# Patient Record
Sex: Male | Born: 1945 | Race: White | Hispanic: No
Health system: Southern US, Community
[De-identification: ages and names within clinical notes are randomized; demographics above are authoritative.]

## PROBLEM LIST (undated history)

## (undated) DIAGNOSIS — C169 Malignant neoplasm of stomach, unspecified: Secondary | ICD-10-CM

## (undated) DIAGNOSIS — K219 Gastro-esophageal reflux disease without esophagitis: Secondary | ICD-10-CM

## (undated) DIAGNOSIS — E119 Type 2 diabetes mellitus without complications: Secondary | ICD-10-CM

---

## 2019-11-23 ENCOUNTER — Emergency Department (HOSPITAL_COMMUNITY): Payer: Medicare Other

## 2019-11-23 ENCOUNTER — Other Ambulatory Visit: Payer: Self-pay

## 2019-11-23 ENCOUNTER — Encounter (HOSPITAL_COMMUNITY): Admission: EM | Disposition: E | Payer: Self-pay | Source: Home / Self Care | Attending: Pulmonary Disease

## 2019-11-23 ENCOUNTER — Encounter (HOSPITAL_COMMUNITY): Payer: Self-pay | Admitting: *Deleted

## 2019-11-23 DIAGNOSIS — D72829 Elevated white blood cell count, unspecified: Secondary | ICD-10-CM

## 2019-11-23 DIAGNOSIS — Z515 Encounter for palliative care: Secondary | ICD-10-CM | POA: Diagnosis present

## 2019-11-23 DIAGNOSIS — J9601 Acute respiratory failure with hypoxia: Secondary | ICD-10-CM | POA: Diagnosis not present

## 2019-11-23 DIAGNOSIS — Z85028 Personal history of other malignant neoplasm of stomach: Secondary | ICD-10-CM

## 2019-11-23 DIAGNOSIS — N179 Acute kidney failure, unspecified: Secondary | ICD-10-CM | POA: Diagnosis present

## 2019-11-23 DIAGNOSIS — R001 Bradycardia, unspecified: Secondary | ICD-10-CM | POA: Diagnosis not present

## 2019-11-23 DIAGNOSIS — I501 Left ventricular failure: Secondary | ICD-10-CM | POA: Diagnosis present

## 2019-11-23 DIAGNOSIS — I442 Atrioventricular block, complete: Secondary | ICD-10-CM | POA: Diagnosis not present

## 2019-11-23 DIAGNOSIS — J9690 Respiratory failure, unspecified, unspecified whether with hypoxia or hypercapnia: Secondary | ICD-10-CM

## 2019-11-23 DIAGNOSIS — I2119 ST elevation (STEMI) myocardial infarction involving other coronary artery of inferior wall: Secondary | ICD-10-CM | POA: Diagnosis not present

## 2019-11-23 DIAGNOSIS — R570 Cardiogenic shock: Secondary | ICD-10-CM | POA: Diagnosis present

## 2019-11-23 DIAGNOSIS — K219 Gastro-esophageal reflux disease without esophagitis: Secondary | ICD-10-CM | POA: Diagnosis present

## 2019-11-23 DIAGNOSIS — Z452 Encounter for adjustment and management of vascular access device: Secondary | ICD-10-CM

## 2019-11-23 DIAGNOSIS — I2101 ST elevation (STEMI) myocardial infarction involving left main coronary artery: Secondary | ICD-10-CM

## 2019-11-23 DIAGNOSIS — Z20828 Contact with and (suspected) exposure to other viral communicable diseases: Secondary | ICD-10-CM | POA: Diagnosis present

## 2019-11-23 DIAGNOSIS — E119 Type 2 diabetes mellitus without complications: Secondary | ICD-10-CM | POA: Diagnosis present

## 2019-11-23 DIAGNOSIS — E872 Acidosis: Secondary | ICD-10-CM | POA: Diagnosis present

## 2019-11-23 DIAGNOSIS — Z9221 Personal history of antineoplastic chemotherapy: Secondary | ICD-10-CM

## 2019-11-23 DIAGNOSIS — R451 Restlessness and agitation: Secondary | ICD-10-CM | POA: Diagnosis present

## 2019-11-23 DIAGNOSIS — Z978 Presence of other specified devices: Secondary | ICD-10-CM

## 2019-11-23 HISTORY — DX: Malignant neoplasm of stomach, unspecified: C16.9

## 2019-11-23 HISTORY — DX: Type 2 diabetes mellitus without complications: E11.9

## 2019-11-23 HISTORY — DX: Gastro-esophageal reflux disease without esophagitis: K21.9

## 2019-11-23 LAB — CBG MONITORING, ED: Glucose-Capillary: 250 mg/dL — ABNORMAL HIGH (ref 70–99)

## 2019-11-23 LAB — CBC WITH DIFFERENTIAL/PLATELET
Abs Immature Granulocytes: 0.18 10*3/uL — ABNORMAL HIGH (ref 0.00–0.07)
Basophils Absolute: 0.1 10*3/uL (ref 0.0–0.1)
Basophils Relative: 0 %
Eosinophils Absolute: 0 10*3/uL (ref 0.0–0.5)
Eosinophils Relative: 0 %
HCT: 39.4 % (ref 39.0–52.0)
Hemoglobin: 12.1 g/dL — ABNORMAL LOW (ref 13.0–17.0)
Immature Granulocytes: 1 %
Lymphocytes Relative: 17 %
Lymphs Abs: 2.9 10*3/uL (ref 0.7–4.0)
MCH: 30 pg (ref 26.0–34.0)
MCHC: 30.7 g/dL (ref 30.0–36.0)
MCV: 97.5 fL (ref 80.0–100.0)
Monocytes Absolute: 1.6 10*3/uL — ABNORMAL HIGH (ref 0.1–1.0)
Monocytes Relative: 9 %
Neutro Abs: 12.2 10*3/uL — ABNORMAL HIGH (ref 1.7–7.7)
Neutrophils Relative %: 73 %
Platelets: 306 10*3/uL (ref 150–400)
RBC: 4.04 MIL/uL — ABNORMAL LOW (ref 4.22–5.81)
RDW: 12.8 % (ref 11.5–15.5)
WBC: 17 10*3/uL — ABNORMAL HIGH (ref 4.0–10.5)
nRBC: 0.1 % (ref 0.0–0.2)

## 2019-11-23 LAB — COMPREHENSIVE METABOLIC PANEL
ALT: 591 U/L — ABNORMAL HIGH (ref 0–44)
AST: 738 U/L — ABNORMAL HIGH (ref 15–41)
Albumin: 3.6 g/dL (ref 3.5–5.0)
Alkaline Phosphatase: 121 U/L (ref 38–126)
Anion gap: >20 — ABNORMAL HIGH (ref 5–15)
BUN: 51 mg/dL — ABNORMAL HIGH (ref 8–23)
CO2: 16 mmol/L — ABNORMAL LOW (ref 22–32)
Calcium: 8.4 mg/dL — ABNORMAL LOW (ref 8.9–10.3)
Chloride: 97 mmol/L — ABNORMAL LOW (ref 98–111)
Creatinine, Ser: 6.58 mg/dL — ABNORMAL HIGH (ref 0.61–1.24)
GFR calc Af Amer: 9 mL/min — ABNORMAL LOW (ref >60)
GFR calc non Af Amer: 8 mL/min — ABNORMAL LOW (ref >60)
Glucose, Bld: 305 mg/dL — ABNORMAL HIGH (ref 70–99)
Potassium: 4.5 mmol/L (ref 3.5–5.1)
Sodium: 134 mmol/L — ABNORMAL LOW (ref 135–145)
Total Bilirubin: 1 mg/dL (ref 0.3–1.2)
Total Protein: 7.4 g/dL (ref 6.5–8.1)

## 2019-11-23 LAB — POCT I-STAT 7, (LYTES, BLD GAS, ICA,H+H)
Acid-base deficit: 13 mmol/L — ABNORMAL HIGH (ref 0.0–2.0)
Bicarbonate: 14.8 mmol/L — ABNORMAL LOW (ref 20.0–28.0)
Calcium, Ion: 1 mmol/L — ABNORMAL LOW (ref 1.15–1.40)
HCT: 33 % — ABNORMAL LOW (ref 39.0–52.0)
Hemoglobin: 11.2 g/dL — ABNORMAL LOW (ref 13.0–17.0)
O2 Saturation: 100 %
Patient temperature: 98.4
Potassium: 5 mmol/L (ref 3.5–5.1)
Sodium: 136 mmol/L (ref 135–145)
TCO2: 16 mmol/L — ABNORMAL LOW (ref 22–32)
pCO2 arterial: 39.1 mmHg (ref 32.0–48.0)
pH, Arterial: 7.184 — CL (ref 7.350–7.450)
pO2, Arterial: 236 mmHg — ABNORMAL HIGH (ref 83.0–108.0)

## 2019-11-23 LAB — I-STAT CHEM 8, ED
BUN: 47 mg/dL — ABNORMAL HIGH (ref 8–23)
Calcium, Ion: 1.07 mmol/L — ABNORMAL LOW (ref 1.15–1.40)
Chloride: 104 mmol/L (ref 98–111)
Creatinine, Ser: 6.4 mg/dL — ABNORMAL HIGH (ref 0.61–1.24)
Glucose, Bld: 293 mg/dL — ABNORMAL HIGH (ref 70–99)
HCT: 40 % (ref 39.0–52.0)
Hemoglobin: 13.6 g/dL (ref 13.0–17.0)
Potassium: 4.6 mmol/L (ref 3.5–5.1)
Sodium: 135 mmol/L (ref 135–145)
TCO2: 18 mmol/L — ABNORMAL LOW (ref 22–32)

## 2019-11-23 LAB — POC SARS CORONAVIRUS 2 AG -  ED: SARS Coronavirus 2 Ag: NEGATIVE

## 2019-11-23 LAB — TROPONIN I (HIGH SENSITIVITY): Troponin I (High Sensitivity): 24310 ng/L (ref <18)

## 2019-11-23 LAB — RESPIRATORY PANEL BY RT PCR (FLU A&B, COVID)
Influenza A by PCR: NEGATIVE
Influenza B by PCR: NEGATIVE
SARS Coronavirus 2 by RT PCR: NEGATIVE

## 2019-11-23 SURGERY — CORONARY/GRAFT ACUTE MI REVASCULARIZATION
Anesthesia: LOCAL

## 2019-11-23 MED ORDER — HEPARIN (PORCINE) IN NACL 1000-0.9 UT/500ML-% IV SOLN
INTRAVENOUS | Status: AC
  Filled 2019-11-23: qty 1000

## 2019-11-23 MED ORDER — HEPARIN BOLUS VIA INFUSION
4000.0000 [IU] | Freq: Once | INTRAVENOUS | Status: AC
  Administered 2019-11-23: 4000 [IU] via INTRAVENOUS
  Filled 2019-11-23: qty 4000

## 2019-11-23 MED ORDER — LIDOCAINE HCL (PF) 1 % IJ SOLN
INTRAMUSCULAR | Status: AC
  Filled 2019-11-23: qty 30

## 2019-11-23 MED ORDER — DOPAMINE-DEXTROSE 3.2-5 MG/ML-% IV SOLN
0.0000 ug/kg/min | INTRAVENOUS | Status: DC
  Administered 2019-11-23: 6.717 ug/kg/min via INTRAVENOUS

## 2019-11-23 MED ORDER — FENTANYL CITRATE (PF) 100 MCG/2ML IJ SOLN
100.0000 ug | Freq: Once | INTRAMUSCULAR | Status: AC
  Administered 2019-11-24: 100 ug via INTRAVENOUS
  Filled 2019-11-23: qty 2

## 2019-11-23 MED ORDER — MIDAZOLAM HCL 2 MG/2ML IJ SOLN
2.0000 mg | Freq: Once | INTRAMUSCULAR | Status: AC
  Administered 2019-11-23: 2 mg via INTRAVENOUS

## 2019-11-23 MED ORDER — ETOMIDATE 2 MG/ML IV SOLN
INTRAVENOUS | Status: AC | PRN
  Administered 2019-11-23: 20 mg via INTRAVENOUS

## 2019-11-23 MED ORDER — ROCURONIUM BROMIDE 50 MG/5ML IV SOLN
INTRAVENOUS | Status: AC | PRN
  Administered 2019-11-23: 100 mg via INTRAVENOUS

## 2019-11-23 MED ORDER — ATROPINE SULFATE 1 MG/ML IJ SOLN
1.0000 mg | Freq: Once | INTRAMUSCULAR | Status: AC
  Administered 2019-11-23: 1 mg via INTRAVENOUS

## 2019-11-23 MED ORDER — SODIUM CHLORIDE 0.9 % IV SOLN: INTRAVENOUS | Status: DC

## 2019-11-23 MED ORDER — DOBUTAMINE IN D5W 4-5 MG/ML-% IV SOLN
2.5000 ug/kg/min | INTRAVENOUS | Status: DC
  Administered 2019-11-23: 2.5 ug/kg/min via INTRAVENOUS
  Filled 2019-11-23: qty 250

## 2019-11-23 MED ORDER — HEPARIN (PORCINE) 25000 UT/250ML-% IV SOLN
1150.0000 [IU]/h | INTRAVENOUS | Status: DC
  Administered 2019-11-23: 950 [IU]/h via INTRAVENOUS
  Administered 2019-11-24: 1150 [IU]/h via INTRAVENOUS

## 2019-11-23 MED ORDER — INSULIN ASPART 100 UNIT/ML ~~LOC~~ SOLN: 2.0000 [IU] | SUBCUTANEOUS | Status: DC

## 2019-11-23 MED ORDER — STERILE WATER FOR INJECTION IV SOLN
INTRAVENOUS | Status: DC
  Filled 2019-11-23: qty 850

## 2019-11-23 MED ORDER — MIDAZOLAM HCL 2 MG/2ML IJ SOLN
INTRAMUSCULAR | Status: AC
  Filled 2019-11-23: qty 2

## 2019-11-23 MED ORDER — FENTANYL CITRATE (PF) 100 MCG/2ML IJ SOLN
INTRAMUSCULAR | Status: AC
  Filled 2019-11-23: qty 2

## 2019-11-23 MED ORDER — VERAPAMIL HCL 2.5 MG/ML IV SOLN
INTRAVENOUS | Status: AC
  Filled 2019-11-23: qty 2

## 2019-11-23 MED ORDER — FENTANYL CITRATE (PF) 100 MCG/2ML IJ SOLN
INTRAMUSCULAR | Status: AC | PRN
  Administered 2019-11-23: 100 ug via INTRAVENOUS

## 2019-11-23 MED ORDER — SODIUM BICARBONATE 8.4 % IV SOLN
100.0000 meq | Freq: Once | INTRAVENOUS | Status: AC
  Administered 2019-11-23: 100 meq via INTRAVENOUS
  Filled 2019-11-23: qty 50

## 2019-11-23 MED ORDER — LORAZEPAM 2 MG/ML IJ SOLN
0.5000 mg | Freq: Once | INTRAMUSCULAR | Status: AC
  Administered 2019-11-23: 0.5 mg via INTRAVENOUS
  Filled 2019-11-23: qty 1

## 2019-11-23 NOTE — ED Provider Notes (Addendum)
[facility] EMERGENCY DEPARTMENT Provider Note   CSN: [id number] Arrival date & time: 12/14/2019  1800     History   Chief Complaint Chief Complaint  Patient presents with  . Altered Mental Status    HPI Jose Collins is a 73 y.o. male.     Patient presents with altered mental status.  While in the emergency department he started having agonal breaths.  The history is provided by medical records and a relative.  Altered Mental Status Presenting symptoms: behavior changes   Severity:  Severe Most recent episode:  Today Episode history:  Continuous Timing:  Constant Progression:  Worsening Chronicity:  New Context: not alcohol use   Associated symptoms: no abdominal pain     Past Medical History:  Diagnosis Date  . Diabetes mellitus without complication ([place])   . GERD (gastroesophageal reflux disease)   . Stomach cancer ([place])     There are no active problems to display for this patient.       Home Medications    Prior to Admission medications   Not on File    Family History No family history on file.  Social History Social History   Tobacco Use  . Smoking status: Not on file  Substance Use Topics  . Alcohol use: Not on file  . Drug use: Not on file     Allergies   Patient has no allergy information on record.   Review of Systems Review of Systems  Unable to perform ROS: Mental status change  Gastrointestinal: Negative for abdominal pain.     Physical Exam Updated Vital Signs BP (!) 64/50   Pulse (!) 45   Ht 5\' 7"  (1.702 m)   Wt 79.4 kg   SpO2 93%   BMI 27.41 kg/m   Physical Exam Vitals signs reviewed.  Constitutional:      Appearance: He is well-developed.     Comments: Patient lethargic  HENT:     Head: Normocephalic.     Nose: Nose normal.  Eyes:     General: No scleral icterus.    Conjunctiva/sclera: Conjunctivae normal.  Neck:     Musculoskeletal: Neck supple.     Thyroid: No thyromegaly.  Cardiovascular:   Heart sounds: No murmur. No friction rub. No gallop.      Comments: Patient has a rate of 40 and is in third-degree heart block Pulmonary:     Breath sounds: No stridor. No wheezing or rales.  Chest:     Chest wall: No tenderness.  Abdominal:     General: There is no distension.     Tenderness: There is no abdominal tenderness. There is no rebound.  Musculoskeletal: Normal range of motion.  Lymphadenopathy:     Cervical: No cervical adenopathy.  Skin:    Findings: No erythema or rash.  Neurological:     Motor: No abnormal muscle tone.     Coordination: Coordination normal.     Comments: Responding to painful stimuli only      ED Treatments / Results  Labs (all labs ordered are listed, but only abnormal results are displayed) Labs Reviewed  CBC WITH DIFFERENTIAL/PLATELET - Abnormal; Notable for the following components:      Result Value   WBC 17.0 (*)    RBC 4.04 (*)    Hemoglobin 12.1 (*)    Neutro Abs 12.2 (*)    Monocytes Absolute 1.6 (*)    Abs Immature Granulocytes 0.18 (*)    All other components within normal limits  CBG MONITORING, ED - Abnormal; Notable for the following components:   Glucose-Capillary 250 (*)    All other components within normal limits  I-STAT CHEM 8, ED - Abnormal; Notable for the following components:   BUN 47 (*)    Creatinine, Ser 6.40 (*)    Glucose, Bld 293 (*)    Calcium, Ion 1.07 (*)    TCO2 18 (*)    All other components within normal limits  SARS CORONAVIRUS 2 (TAT 6-24 HRS)  RESPIRATORY PANEL BY RT PCR (FLU A&B, COVID)  COMPREHENSIVE METABOLIC PANEL  TROPONIN I (HIGH SENSITIVITY)    EKG None  Radiology No results found.  Procedures Procedures (including critical care time)  Medications Ordered in ED Medications  atropine injection 1 mg (1 mg Intravenous Given 12/15/2019 1838)  atropine injection 1 mg (1 mg Intravenous Given 12/10/2019 1841)  atropine injection 1 mg (1 mg Intravenous Given 11/28/2019 1851)     Initial  Impression / Assessment and Plan / ED Course  I have reviewed the triage vital signs and the nursing notes.  Pertinent labs & imaging results that were available during my care of the patient were reviewed by me and considered in my medical decision making (see chart for details).        CRITICAL CARE Performed by: Milton Ferguson Total critical care time:40 minutes Critical care time was exclusive of separately billable procedures and treating other patients. Critical care was necessary to treat or prevent imminent or life-threatening deterioration. Critical care was time spent personally by me on the following activities: development of treatment plan with patient and/or surrogate as well as nursing, discussions with consultants, evaluation of patient's response to treatment, examination of patient, obtaining history from patient or surrogate, ordering and performing treatments and interventions, ordering and review of laboratory studies, ordering and review of radiographic studies, pulse oximetry and re-evaluation of patient's condition. Patient with agonal breaths.  He responded some to Narcan   patient was in third-degree heart block and hypotensive.  He was given atropine twice and responded minimally.  Then was started on a pacer.  His EKG suggest a STEMI.  We contacted Dr. Lorra Hals seeing cardiology and he accepted the patient to go to the Cath Lab now. Dr.Varonosi called back after the patient left the ED to go to the Cath Lab.  He decided he wanted the patient to stop in the ED and be evaluated again because he may need intubation before the procedure is done.  He thinks the patient may be too combative to get the procedure without intubation.  I called Dr.Steinl and notified him of the patient Final Clinical Impressions(s) / ED Diagnoses   Final diagnoses:  ST elevation myocardial infarction involving left main coronary artery [facility])  Third degree heart block [facility])    ED Discharge  Orders    None       Milton Ferguson, MD 11/23/19 1919    Milton Ferguson, MD 11/23/19 2018

## 2019-11-23 NOTE — ED Notes (Signed)
Pt still unresponsive, breathing and pulse present.

## 2019-11-23 NOTE — Procedures (Signed)
Endotracheal Intubation Procedure Note  Indication for endotracheal intubation: airway compromise and respiratory failure. Intubated for airway protection, AMS, increased work of breathing Airway Assessment: Mallampati Class: unable to assess, patient obtunded. Sedation: etomidate and fentanyl. Paralytic: rocuronium. Lidocaine: no. Atropine: no. Equipment: Macintosh 3 laryngoscope blade. Cricoid Pressure: yes. Number of attempts: 1. ETT location confirmed by by auscultation and colorimetry, CXR pending.  Collier Bullock 12/03/2019

## 2019-11-23 NOTE — Consult Note (Signed)
Cardiology Consultation:   Patient ID: Jose Collins MRN: [id number]; DOB: [birth year]  Admit date: 12/04/2019 Date of Consult: 12/13/2019  Primary Care Provider: [facility] Primary Cardiologist: No primary care provider on file. new (seen at [facility]) Primary Electrophysiologist:  None    Patient Profile:   Jose Collins is a 73 y.o. male with a hx of gastric cancer who is being seen today for Jose evaluation of dizziness/  at Jose request of Dr. Dewayne Hatch and Dr. Vanita Panda.  History of Present Illness:   Jose Collins is unable to give history.  He is confused and does not answer questions.  He only request water.  History was obtained by Jose Collins who is at bedside.  I also was on speaker phone when Dr. Roderic Palau was speaking to Jose Collins at [facility]  Jose patient came in earlier today with confusion.  ECG showed inferior ST elevation and there was episodic complete heart block.  He was transcutaneously paced.  He was found to be in renal failure with a creatinine of 6.4.  No baseline creatinine is available.  Jose Collins was unaware of any renal insufficiency but did state that Jose father typically did not share Jose health problems with Jose children as did not burden them.  Jose Collins spoke to Jose dad earlier today.  Jose mental status at Jose time was more clear.  He was short of breath and coughing frequently.  Apparently he had a respiratory illness about a week ago.  At that time he and Jose Collins who lives with them were tested for Covid and they were both negative.  No fevers or chills were reported.  Just shortness of breath and wheezing.  Per Jose Collins, Jose patient typically does take care of himself.  He [facility] most of Jose care at [facility].  Currently, Jose patient does not report chest pain.  He only reports that he wants water.  Transcutaneous pacer was turned down.  Jose patient did have rates anywhere from 68s through 66s.  By Jose time I left Jose room, he was  sinus bradycardia in Jose 60s.  Blood pressure was 96/62 when checked manually.  Jose automated cuff was not able to pick up blood pressure.  Heart Pathway Score:     Past Medical History:  Diagnosis Date  . Diabetes mellitus without complication ([place])   . GERD (gastroesophageal reflux disease)   . Stomach cancer [facility])          Inpatient Medications: Scheduled Meds:  Continuous Infusions:  PRN Meds:   Allergies:   No Known Allergies  Social History:  Lives with Jose Collins; was a [place] veteran per Jose Collins.  Unable to obtain history from Jose patient.  Social History   Socioeconomic History  . Marital status: Divorced    Spouse name: Not on file  . Number of children: Not on file  . Years of education: Not on file  . Highest education level: Not on file  Occupational History  . Not on file  Social Needs  . Financial resource strain: Not on file  . Food insecurity    Worry: Not on file    Inability: Not on file  . Transportation needs    Medical: Not on file    Non-medical: Not on file  Tobacco Use  . Smoking status: Not on file  Substance and Sexual Activity  . Alcohol use: Not on file  . Drug use: Not on file  .  Sexual activity: Not on file  Lifestyle  . Physical activity    Days per week: Not on file    Minutes per session: Not on file  . Stress: Not on file  Relationships  . Social Herbalist on phone: Not on file    Gets together: Not on file    Attends religious service: Not on file    Active member of club or organization: Not on file    Attends meetings of clubs or organizations: Not on file    Relationship status: Not on file  . Intimate partner violence    Fear of current or ex partner: Not on file    Emotionally abused: Not on file    Physically abused: Not on file    Forced sexual activity: Not on file  Other Topics Concern  . Not on file  Social History Narrative  . Not on file    Family History:   *No family history on  file.  Patient unable to give history.  ROS:  Please see Jose history of present illness.  Cough and wheezing per Jose Collins.  Patient unable to give history. All other ROS reviewed and negative.     Physical Exam/Data:   Vitals:   11/17/2019 1839 12/06/2019 1842 11/16/2019 1847 11/29/2019 1849  BP:   (!) 69/35 (!) 64/50  Pulse: (!) 51  (!) 45   SpO2:  96% 93%   Weight:      Height:       No intake or output data in Jose 24 hours ending 12/04/2019 2030 Last 3 Weights 12/07/2019  Weight (lbs) 175 lb  Weight (kg) 79.379 kg     Body mass index is 27.41 kg/m.  General:  Well nourished, well developed, agitated HEENT: normal Lymph: no adenopathy Neck: no JVD Endocrine:  No thryomegaly  Cardiac:  normal S1, S2; bradycardic; no audible murmurs Lungs: Mild wheezing bilaterally Abd: Obese Ext: no lower extremity edema Musculoskeletal:  No deformities, BUE and BLE strength normal and equal Skin: warm and dry  Neuro:  CNs 2-12 intact, no focal abnormalities noted Psych: Agitated  EKG:  Jose EKG was personally reviewed and demonstrates: Sinus rhythm with complete heart block and junctional escape rhythm, inferior Q waves with residual ST elevation Telemetry:  Telemetry was personally reviewed and demonstrates: At one point he was sinus bradycardia in Jose high 50s  Relevant CV Studies:   Laboratory Data:  High Sensitivity Troponin:   Recent Labs  Lab 11/25/2019 1832  TROPONINIHS 24,310*     Chemistry Recent Labs  Lab 11/28/2019 1832 12/03/2019 1836  NA 134* 135  K 4.5 4.6  CL 97* 104  CO2 16*  --   GLUCOSE 305* 293*  BUN 51* 47*  CREATININE 6.58* 6.40*  CALCIUM 8.4*  --   GFRNONAA 8*  --   GFRAA 9*  --   ANIONGAP >20*  --     Recent Labs  Lab 11/22/2019 1832  PROT 7.4  ALBUMIN 3.6  AST 738*  ALT 591*  ALKPHOS 121  BILITOT 1.0   Hematology Recent Labs  Lab 11/18/2019 1832 12/11/2019 1836  WBC 17.0*  --   RBC 4.04*  --   HGB 12.1* 13.6  HCT 39.4 40.0  MCV 97.5  --   MCH  30.0  --   MCHC 30.7  --   RDW 12.8  --   PLT 306  --    BNPNo results for input(s): BNP, PROBNP in  Jose last 168 hours.  DDimer No results for input(s): DDIMER in Jose last 168 hours.   Radiology/Studies:  No results found.  Assessment and Plan:   1. Subacute inferior MI: Based on Jose troponin, this MI occurred likely within Jose last few days.  Jose rhythm disturbance is likely related to Jose MI.  I spoke at length with Jose Collins.  Given Jose acute renal failure and Jose likelihood that we would not salvage much myocardium, we decided not to pursue coronary angiography.  2.    Complete heart block: Transcutaneous pacing was effective when he was found to be hypotensive at [employer].  Jose only way Jose patient would tolerate a transvenous pacer would be if he was intubated and sedated.  He is not cooperative enough to lie still.  If Jose heart rate were to drop again, would consider IV dopamine which would help both with Jose pressure and Jose heart rate.    3.  Acute renal failure: No records to know what Jose baseline creatinine is.  Collins did not know of any renal problems but admitted that Jose father did not tell Jose family members what about Jose health issues.  Some of this may be reversible, but it is unclear.  4.   Elevated white blood cell count: There may be some infection present, contributing to Jose hypotension.  Continue supportive care.  We will follow along.  Would use IV heparin for 24 to 48 hours at least given Jose MI.  Check echocardiogram.  Findings d/w Dr. Vanita Panda.     Critical care time 60 minutes.     For questions or updates, please contact [place] Please consult [email or web address] for contact info under     Signed, Larae Grooms, MD  12/11/2019 8:30 PM

## 2019-11-23 NOTE — ED Notes (Signed)
Dr. Roderic Palau ordered to pace pt at rate of 60.

## 2019-11-23 NOTE — ED Notes (Signed)
EKG repeated

## 2019-11-23 NOTE — ED Notes (Signed)
Dr. Roderic Palau testing, gag reflex, pt has gag reflex.

## 2019-11-23 NOTE — ED Notes (Signed)
Capture at 74ma at rate of 60.

## 2019-11-23 NOTE — ED Notes (Signed)
Pt became unresponsive in hallway. Pt moved to a room. Pulse present, o2 sat in 60's.  Pt being bagged, o2 sat increasing,  81% at present.  EDP at bedside.  CBG 250.  Narcan 2mg  IV given.

## 2019-11-23 NOTE — ED Triage Notes (Signed)
Pt brought in by [facility] with c/o AMS that started approximately 1 hour PTA. Family on scene reported to EMS that this was very abnormal for pt. Pt is very confused at time of triage. EMS reports he knew who his family was but he was confused about everything else going on. EMS reports BP was low at 70/42 from IT trainer first responders. EMS unable to obtain BP due to pt's confusion with agitation.

## 2019-11-23 NOTE — ED Notes (Signed)
2 liters of ns infusing,  BP 67/53

## 2019-11-23 NOTE — ED Notes (Signed)
Date and time results received: 12/03/2019 19:20 (use smartphrase ".now" to insert current time)  Test: trop  Critical Value: 24310  Name of Provider Notified: Dr Roderic Palau  Orders Received? Or Actions Taken?: none

## 2019-11-23 NOTE — ED Provider Notes (Addendum)
Patient seen on arrival from any pending, and his case discussed with our cardiology colleagues, soon thereafter. Please see initial note for the patient's initial assessment. In essence the patient presented with altered mental status, was found to have complete heart block and evidence for coronary ischemia. Patient started on transcutaneous pacer, sent here for evaluation peer Here, the patient has been able to maintain native sinus rhythm, though with frequent bradycardia.  He remains hypotensive, and labs now returning are notable for acute kidney injury, with a creatinine greater than 6. Patient is here with his son who does provide some historical details, the patient himself does not provide any. With concern for hypotension, bradycardia, and new renal dysfunction, patient is not a great candidate for catheterization for pacer or cardiac cath, per cardiology. Patient will require admission for fluid resuscitation, blood pressure support, dobutamine started, fluids running. Case discussed with critical care for admission.  10:09 PM  With critical care at bedside, with a group discussion about the patient's presentation, critical illness Patient is now on dopamine, but to start epinephrine, again requiring transcutaneous pacing, as there is evidence for hypoperfusion, likely contributing to his presentation.   11:16 PM Critical care is managing the patient's care, but chart update reasonable, patient continues to worsen, requiring intubation. Goals of care discussed with critical care, the patient's family members, who note that the patient is reasonable, but prefer no CPR.  CRITICAL CARE Performed by: Carmin Muskrat Total critical care time: 35 minutes Critical care time was exclusive of separately billable procedures and treating other patients. Critical care was necessary to treat or prevent imminent or life-threatening deterioration. Critical care was time spent personally by me  on the following activities: development of treatment plan with patient and/or surrogate as well as nursing, discussions with consultants, evaluation of patient's response to treatment, examination of patient, obtaining history from patient or surrogate, ordering and performing treatments and interventions, ordering and review of laboratory studies, ordering and review of radiographic studies, pulse oximetry and re-evaluation of patient's condition.   EKG Interpretation  Date/Time:  Tuesday November 23 2019 20:10:53 EST Ventricular Rate:  39 PR Interval:    QRS Duration: 116 QT Interval:  514 QTC Calculation: 414 R Axis:   -57 Text Interpretation: Sinus bradycardia Atrial premature complex Prolonged PR interval Nonspecific IVCD with LAD Inferior infarct, recent Consider anterolateral infarct Abnormal ECG Confirmed by Carmin Muskrat [phone]) on 11/26/2019 9:07:00 PM          Carmin Muskrat, MD 11/21/2019 2107    Carmin Muskrat, MD 12/02/2019 2210    Carmin Muskrat, MD 12/08/2019 2316

## 2019-11-23 NOTE — ED Notes (Signed)
EMS at bedside

## 2019-11-23 NOTE — ED Notes (Signed)
Pt verbal at this time.  Dr. Roderic Palau back at bedside.

## 2019-11-23 NOTE — ED Notes (Signed)
Respiratory placed nasal trumpet in left nare. Pt responsive during procedure, tolerated well.   NRB placed over nasal trumpet.  Pt diaphoretic.

## 2019-11-23 NOTE — Progress Notes (Signed)
ANTICOAGULATION CONSULT NOTE - Initial Consult  Pharmacy Consult for Heparin  Indication: chest pain/ACS  No Known Allergies  Patient Measurements: Height: 5\' 7"  (170.2 cm) Weight: 175 lb (79.4 kg) IBW/kg (Calculated) : 66.1 Heparin Dosing Weight: 79.4 kg  Vital Signs: BP: 74/48 (12/08 2045) Pulse Rate: 43 (12/08 2045)  Labs: Recent Labs    11/18/2019 1832 12/03/2019 1836  HGB 12.1* 13.6  HCT 39.4 40.0  PLT 306  --   CREATININE 6.58* 6.40*  TROPONINIHS 24,310*  --     Estimated Creatinine Clearance: 10.5 mL/min (A) (by C-G formula based on SCr of 6.4 mg/dL (H)).   Medical History: Past Medical History:  Diagnosis Date  . Diabetes mellitus without complication ([place])   . GERD (gastroesophageal reflux disease)   . Stomach cancer ([facility])     Medications:  Infusions:  . DOBUTamine    . DOPamine    . heparin    .  sodium bicarbonate (isotonic) infusion in sterile water      Assessment: Patient is a 12 yom that present to the ED with confusion. When an EKG was obtained there was inferior ST elevation and episodic complete heart block. Patient was paced and transferred to [facility] Pharmacy has been consulted to dose heparin in this patient for their MI.   Goal of Therapy:  Heparin level 0.3-0.7 units/ml Monitor platelets by anticoagulation protocol: Yes   Plan:  - Heparin bolus of 4000 units IV x 1 dose  - Followed by Heparin drip @ 950 units/hr  - Obtain Heparin level in 8 hours (with am labs)  - Monitor patient for s/s of bleeding and CBC daily while on heparin.   Duanne Limerick PharmD. BCPS  11/23/2019,9:28 PM

## 2019-11-23 NOTE — ED Notes (Signed)
Pt attempting to get oob.  Redirected pt.

## 2019-11-24 ENCOUNTER — Inpatient Hospital Stay (HOSPITAL_COMMUNITY): Payer: Medicare Other

## 2019-11-24 DIAGNOSIS — E119 Type 2 diabetes mellitus without complications: Secondary | ICD-10-CM | POA: Diagnosis present

## 2019-11-24 DIAGNOSIS — J9690 Respiratory failure, unspecified, unspecified whether with hypoxia or hypercapnia: Secondary | ICD-10-CM | POA: Diagnosis present

## 2019-11-24 DIAGNOSIS — I361 Nonrheumatic tricuspid (valve) insufficiency: Secondary | ICD-10-CM | POA: Diagnosis not present

## 2019-11-24 DIAGNOSIS — Z9221 Personal history of antineoplastic chemotherapy: Secondary | ICD-10-CM | POA: Diagnosis not present

## 2019-11-24 DIAGNOSIS — R570 Cardiogenic shock: Secondary | ICD-10-CM | POA: Diagnosis present

## 2019-11-24 DIAGNOSIS — K219 Gastro-esophageal reflux disease without esophagitis: Secondary | ICD-10-CM | POA: Diagnosis present

## 2019-11-24 DIAGNOSIS — I340 Nonrheumatic mitral (valve) insufficiency: Secondary | ICD-10-CM | POA: Diagnosis not present

## 2019-11-24 DIAGNOSIS — J9601 Acute respiratory failure with hypoxia: Secondary | ICD-10-CM | POA: Diagnosis not present

## 2019-11-24 DIAGNOSIS — E872 Acidosis: Secondary | ICD-10-CM | POA: Diagnosis present

## 2019-11-24 DIAGNOSIS — I442 Atrioventricular block, complete: Secondary | ICD-10-CM | POA: Diagnosis present

## 2019-11-24 DIAGNOSIS — I501 Left ventricular failure: Secondary | ICD-10-CM | POA: Diagnosis present

## 2019-11-24 DIAGNOSIS — D72829 Elevated white blood cell count, unspecified: Secondary | ICD-10-CM | POA: Diagnosis present

## 2019-11-24 DIAGNOSIS — I2119 ST elevation (STEMI) myocardial infarction involving other coronary artery of inferior wall: Secondary | ICD-10-CM | POA: Diagnosis present

## 2019-11-24 DIAGNOSIS — Z85028 Personal history of other malignant neoplasm of stomach: Secondary | ICD-10-CM | POA: Diagnosis not present

## 2019-11-24 DIAGNOSIS — R001 Bradycardia, unspecified: Secondary | ICD-10-CM | POA: Diagnosis not present

## 2019-11-24 DIAGNOSIS — Z515 Encounter for palliative care: Secondary | ICD-10-CM | POA: Diagnosis present

## 2019-11-24 DIAGNOSIS — N179 Acute kidney failure, unspecified: Secondary | ICD-10-CM | POA: Diagnosis present

## 2019-11-24 DIAGNOSIS — Z20828 Contact with and (suspected) exposure to other viral communicable diseases: Secondary | ICD-10-CM | POA: Diagnosis present

## 2019-11-24 DIAGNOSIS — R451 Restlessness and agitation: Secondary | ICD-10-CM | POA: Diagnosis present

## 2019-11-24 LAB — POCT I-STAT 7, (LYTES, BLD GAS, ICA,H+H)
Acid-base deficit: 11 mmol/L — ABNORMAL HIGH (ref 0.0–2.0)
Bicarbonate: 16.8 mmol/L — ABNORMAL LOW (ref 20.0–28.0)
Calcium, Ion: 0.98 mmol/L — ABNORMAL LOW (ref 1.15–1.40)
HCT: 33 % — ABNORMAL LOW (ref 39.0–52.0)
Hemoglobin: 11.2 g/dL — ABNORMAL LOW (ref 13.0–17.0)
O2 Saturation: 95 %
Patient temperature: 98.4
Potassium: 5.1 mmol/L (ref 3.5–5.1)
Sodium: 136 mmol/L (ref 135–145)
TCO2: 18 mmol/L — ABNORMAL LOW (ref 22–32)
pCO2 arterial: 43.3 mmHg (ref 32.0–48.0)
pH, Arterial: 7.195 — CL (ref 7.350–7.450)
pO2, Arterial: 90 mmHg (ref 83.0–108.0)

## 2019-11-24 LAB — COMPREHENSIVE METABOLIC PANEL
ALT: 794 U/L — ABNORMAL HIGH (ref 0–44)
AST: 893 U/L — ABNORMAL HIGH (ref 15–41)
Albumin: 2.9 g/dL — ABNORMAL LOW (ref 3.5–5.0)
Alkaline Phosphatase: 134 U/L — ABNORMAL HIGH (ref 38–126)
Anion gap: 24 — ABNORMAL HIGH (ref 5–15)
BUN: 64 mg/dL — ABNORMAL HIGH (ref 8–23)
CO2: 14 mmol/L — ABNORMAL LOW (ref 22–32)
Calcium: 7.3 mg/dL — ABNORMAL LOW (ref 8.9–10.3)
Chloride: 100 mmol/L (ref 98–111)
Creatinine, Ser: 7.05 mg/dL — ABNORMAL HIGH (ref 0.61–1.24)
GFR calc Af Amer: 8 mL/min — ABNORMAL LOW (ref >60)
GFR calc non Af Amer: 7 mL/min — ABNORMAL LOW (ref >60)
Glucose, Bld: 527 mg/dL (ref 70–99)
Potassium: 4.9 mmol/L (ref 3.5–5.1)
Sodium: 138 mmol/L (ref 135–145)
Total Bilirubin: 1.3 mg/dL — ABNORMAL HIGH (ref 0.3–1.2)
Total Protein: 6.1 g/dL — ABNORMAL LOW (ref 6.5–8.1)

## 2019-11-24 LAB — PHOSPHORUS: Phosphorus: 8 mg/dL — ABNORMAL HIGH (ref 2.5–4.6)

## 2019-11-24 LAB — BASIC METABOLIC PANEL
Anion gap: 25 — ABNORMAL HIGH (ref 5–15)
BUN: 57 mg/dL — ABNORMAL HIGH (ref 8–23)
CO2: 13 mmol/L — ABNORMAL LOW (ref 22–32)
Calcium: 7.3 mg/dL — ABNORMAL LOW (ref 8.9–10.3)
Chloride: 101 mmol/L (ref 98–111)
Creatinine, Ser: 6.84 mg/dL — ABNORMAL HIGH (ref 0.61–1.24)
GFR calc Af Amer: 8 mL/min — ABNORMAL LOW (ref >60)
GFR calc non Af Amer: 7 mL/min — ABNORMAL LOW (ref >60)
Glucose, Bld: 406 mg/dL — ABNORMAL HIGH (ref 70–99)
Potassium: 5.4 mmol/L — ABNORMAL HIGH (ref 3.5–5.1)
Sodium: 139 mmol/L (ref 135–145)

## 2019-11-24 LAB — ECHOCARDIOGRAM COMPLETE
Height: 65 in
Weight: 2814.83 oz

## 2019-11-24 LAB — TROPONIN I (HIGH SENSITIVITY): Troponin I (High Sensitivity): 24585 ng/L (ref <18)

## 2019-11-24 LAB — GLUCOSE, CAPILLARY
Glucose-Capillary: 268 mg/dL — ABNORMAL HIGH (ref 70–99)
Glucose-Capillary: 474 mg/dL — ABNORMAL HIGH (ref 70–99)
Glucose-Capillary: 417 mg/dL — ABNORMAL HIGH (ref 70–99)
Glucose-Capillary: 421 mg/dL — ABNORMAL HIGH (ref 70–99)
Glucose-Capillary: 389 mg/dL — ABNORMAL HIGH (ref 70–99)

## 2019-11-24 LAB — MRSA PCR SCREENING: MRSA by PCR: NEGATIVE

## 2019-11-24 LAB — CBC
HCT: 32.4 % — ABNORMAL LOW (ref 39.0–52.0)
Hemoglobin: 10.7 g/dL — ABNORMAL LOW (ref 13.0–17.0)
MCH: 30.2 pg (ref 26.0–34.0)
MCHC: 33 g/dL (ref 30.0–36.0)
MCV: 91.5 fL (ref 80.0–100.0)
Platelets: 267 10*3/uL (ref 150–400)
RBC: 3.54 MIL/uL — ABNORMAL LOW (ref 4.22–5.81)
RDW: 12.5 % (ref 11.5–15.5)
WBC: 17.5 10*3/uL — ABNORMAL HIGH (ref 4.0–10.5)
nRBC: 0 % (ref 0.0–0.2)

## 2019-11-24 LAB — PROCALCITONIN
Procalcitonin: 1.4 ng/mL
Procalcitonin: 3.45 ng/mL

## 2019-11-24 LAB — PROTIME-INR
INR: 1.4 — ABNORMAL HIGH (ref 0.8–1.2)
Prothrombin Time: 16.8 seconds — ABNORMAL HIGH (ref 11.4–15.2)

## 2019-11-24 LAB — HEPARIN LEVEL (UNFRACTIONATED): Heparin Unfractionated: 0.11 IU/mL — ABNORMAL LOW (ref 0.30–0.70)

## 2019-11-24 LAB — MAGNESIUM: Magnesium: 1.5 mg/dL — ABNORMAL LOW (ref 1.7–2.4)

## 2019-11-24 LAB — TRIGLYCERIDES: Triglycerides: 239 mg/dL — ABNORMAL HIGH (ref <150)

## 2019-11-24 LAB — LACTIC ACID, PLASMA: Lactic Acid, Venous: 6.8 mmol/L (ref 0.5–1.9)

## 2019-11-24 LAB — CBG MONITORING, ED: Glucose-Capillary: 315 mg/dL — ABNORMAL HIGH (ref 70–99)

## 2019-11-24 MED ORDER — INSULIN REGULAR(HUMAN) IN NACL 100-0.9 UT/100ML-% IV SOLN
INTRAVENOUS | Status: DC
  Administered 2019-11-24: 3.6 [IU]/h via INTRAVENOUS
  Filled 2019-11-24: qty 100

## 2019-11-24 MED ORDER — GLYCOPYRROLATE 1 MG PO TABS
1.0000 mg | ORAL_TABLET | ORAL | Status: DC | PRN
  Filled 2019-11-24: qty 1

## 2019-11-24 MED ORDER — DIPHENHYDRAMINE HCL 50 MG/ML IJ SOLN: 25.0000 mg | INTRAMUSCULAR | Status: DC | PRN

## 2019-11-24 MED ORDER — CHLORHEXIDINE GLUCONATE CLOTH 2 % EX PADS: 6.0000 | MEDICATED_PAD | Freq: Every day | CUTANEOUS | Status: DC

## 2019-11-24 MED ORDER — PANTOPRAZOLE SODIUM 40 MG IV SOLR
40.0000 mg | INTRAVENOUS | Status: DC
  Administered 2019-11-24: 40 mg via INTRAVENOUS
  Filled 2019-11-24: qty 40

## 2019-11-24 MED ORDER — FENTANYL 2500MCG IN NS 250ML (10MCG/ML) PREMIX INFUSION
0.0000 ug/h | INTRAVENOUS | Status: DC
  Administered 2019-11-24: 100 ug/h via INTRAVENOUS
  Filled 2019-11-24: qty 250

## 2019-11-24 MED ORDER — SODIUM CHLORIDE 0.9% FLUSH
10.0000 mL | Freq: Two times a day (BID) | INTRAVENOUS | Status: DC
  Administered 2019-11-24 (×2): 10 mL

## 2019-11-24 MED ORDER — CHLORHEXIDINE GLUCONATE CLOTH 2 % EX PADS
6.0000 | MEDICATED_PAD | Freq: Every day | CUTANEOUS | Status: DC
  Administered 2019-11-24: 6 via TOPICAL

## 2019-11-24 MED ORDER — EPINEPHRINE HCL 5 MG/250ML IV SOLN IN NS
0.5000 ug/min | INTRAVENOUS | Status: DC
  Administered 2019-11-24: 15 ug/min via INTRAVENOUS
  Administered 2019-11-24: 10 ug/min via INTRAVENOUS
  Filled 2019-11-24: qty 250

## 2019-11-24 MED ORDER — MORPHINE SULFATE (PF) 2 MG/ML IV SOLN: 2.0000 mg | INTRAVENOUS | Status: DC | PRN

## 2019-11-24 MED ORDER — ACETAMINOPHEN 325 MG PO TABS: 650.0000 mg | ORAL_TABLET | Freq: Four times a day (QID) | ORAL | Status: DC | PRN

## 2019-11-24 MED ORDER — MIDAZOLAM HCL 2 MG/2ML IJ SOLN
1.0000 mg | INTRAMUSCULAR | Status: DC | PRN
  Filled 2019-11-24: qty 2

## 2019-11-24 MED ORDER — SODIUM CHLORIDE 0.9% FLUSH: 10.0000 mL | INTRAVENOUS | Status: DC | PRN

## 2019-11-24 MED ORDER — ACETAMINOPHEN 650 MG RE SUPP: 650.0000 mg | Freq: Four times a day (QID) | RECTAL | Status: DC | PRN

## 2019-11-24 MED ORDER — SODIUM BICARBONATE 8.4 % IV SOLN
50.0000 meq | Freq: Once | INTRAVENOUS | Status: AC
  Administered 2019-11-24: 50 meq via INTRAVENOUS
  Filled 2019-11-24: qty 50

## 2019-11-24 MED ORDER — SODIUM CHLORIDE 0.9 % IV SOLN
6.0000 g | Freq: Once | INTRAVENOUS | Status: AC
  Administered 2019-11-24: 6 g via INTRAVENOUS
  Filled 2019-11-24: qty 2

## 2019-11-24 MED ORDER — CHLORHEXIDINE GLUCONATE 0.12 % MT SOLN
15.0000 mL | Freq: Two times a day (BID) | OROMUCOSAL | Status: DC
  Administered 2019-11-24 (×2): 15 mL via OROMUCOSAL
  Filled 2019-11-24: qty 15

## 2019-11-24 MED ORDER — MORPHINE BOLUS VIA INFUSION
5.0000 mg | INTRAVENOUS | Status: DC | PRN
  Filled 2019-11-24: qty 5

## 2019-11-24 MED ORDER — MORPHINE 100MG IN NS 100ML (1MG/ML) PREMIX INFUSION
0.0000 mg/h | INTRAVENOUS | Status: DC
  Administered 2019-11-24: 20 mg/h via INTRAVENOUS
  Filled 2019-11-24: qty 100

## 2019-11-24 MED ORDER — ASPIRIN 300 MG RE SUPP
300.0000 mg | Freq: Every day | RECTAL | Status: DC
  Filled 2019-11-24: qty 1

## 2019-11-24 MED ORDER — SODIUM CHLORIDE 0.9 % IV SOLN
1.0000 g | Freq: Every day | INTRAVENOUS | Status: DC
  Filled 2019-11-24 (×2): qty 10

## 2019-11-24 MED ORDER — SODIUM CHLORIDE 0.9 % IV SOLN
100.0000 mg | Freq: Two times a day (BID) | INTRAVENOUS | Status: DC
  Administered 2019-11-24 (×2): 100 mg via INTRAVENOUS
  Filled 2019-11-24 (×5): qty 100

## 2019-11-24 MED ORDER — PROPOFOL 1000 MG/100ML IV EMUL
5.0000 ug/kg/min | INTRAVENOUS | Status: DC
  Administered 2019-11-24: 5 ug/kg/min via INTRAVENOUS
  Filled 2019-11-24: qty 100

## 2019-11-24 MED ORDER — SODIUM CHLORIDE 0.9 % IV SOLN: INTRAVENOUS | Status: DC

## 2019-11-24 MED ORDER — DEXTROSE-NACL 5-0.45 % IV SOLN: INTRAVENOUS | Status: DC

## 2019-11-24 MED ORDER — SODIUM BICARBONATE 8.4 % IV SOLN
50.0000 meq | Freq: Once | INTRAVENOUS | Status: AC
  Administered 2019-11-24: 50 meq via INTRAVENOUS

## 2019-11-24 MED ORDER — GLYCOPYRROLATE 0.2 MG/ML IJ SOLN: 0.2000 mg | INTRAMUSCULAR | Status: DC | PRN

## 2019-11-24 MED ORDER — POLYVINYL ALCOHOL 1.4 % OP SOLN
1.0000 [drp] | Freq: Four times a day (QID) | OPHTHALMIC | Status: DC | PRN
  Filled 2019-11-24: qty 15

## 2019-11-24 MED ORDER — MIDAZOLAM HCL 2 MG/2ML IJ SOLN
2.0000 mg | Freq: Once | INTRAMUSCULAR | Status: AC
  Administered 2019-11-24: 2 mg via INTRAVENOUS

## 2019-11-24 MED ORDER — HEPARIN BOLUS VIA INFUSION
2000.0000 [IU] | Freq: Once | INTRAVENOUS | Status: AC
  Administered 2019-11-24: 2000 [IU] via INTRAVENOUS
  Filled 2019-11-24: qty 2000

## 2019-11-24 MED ORDER — DEXTROSE 5 % IV SOLN: INTRAVENOUS | Status: DC

## 2019-11-24 MED ORDER — DEXTROSE 50 % IV SOLN: 0.0000 mL | INTRAVENOUS | Status: DC | PRN

## 2019-11-24 MED ORDER — MIDAZOLAM HCL 2 MG/2ML IJ SOLN: 1.0000 mg | INTRAMUSCULAR | Status: DC | PRN

## 2019-11-24 MED ORDER — FENTANYL CITRATE (PF) 100 MCG/2ML IJ SOLN
25.0000 ug | INTRAMUSCULAR | Status: DC | PRN
  Administered 2019-11-24: 100 ug via INTRAVENOUS
  Filled 2019-11-24 (×2): qty 2

## 2019-11-24 MED ORDER — FENTANYL CITRATE (PF) 100 MCG/2ML IJ SOLN: 25.0000 ug | INTRAMUSCULAR | Status: DC | PRN

## 2019-11-25 ENCOUNTER — Telehealth: Payer: Self-pay

## 2019-11-25 LAB — GLUCOSE, CAPILLARY: Glucose-Capillary: 291 mg/dL — ABNORMAL HIGH (ref 70–99)

## 2019-11-25 LAB — URINE CULTURE: Culture: NO GROWTH

## 2019-11-25 NOTE — Telephone Encounter (Signed)
Received dc from [facility].   DC is for cremation and a patient of Doctor Shearon Stalls.   DC will be taken to Pulmonary Unit for signature.  On 11/30/2019 Received the dc back from Doctor Shearon Stalls.  I called the funeral home to let them know the dc is ready for pickup.

## 2019-11-29 ENCOUNTER — Telehealth: Payer: Self-pay

## 2019-11-29 LAB — CULTURE, BLOOD (ROUTINE X 2)
Culture: NO GROWTH
Culture: NO GROWTH
Special Requests: ADEQUATE

## 2019-11-29 MED FILL — Medication: Qty: 1 | Status: AC

## 2019-11-29 NOTE — Telephone Encounter (Signed)
Received dc from [facility].  DC is for cremation and a patient of Doctor Shearon Stalls.   DC will be taken to Pulmonary Unit for signature.  On 11/30/2019 Received the dc back from Doctor Shearon Stalls.  I called the funeral home to let them know I faxed the dc over per their request.

## 2019-12-17 NOTE — Progress Notes (Signed)
Chaplain provided support and prayer to family at bedside of Jose Collins.

## 2019-12-17 NOTE — Progress Notes (Signed)
Patient transported to 2H19 without any complications.

## 2019-12-17 NOTE — Progress Notes (Signed)
Patient time of death: 64.   Verified with Alma Friendly, RN.  All drips d/c'd. Family at bedside.

## 2019-12-17 NOTE — Procedures (Signed)
Arterial Catheter Insertion Procedure Note Jose Collins [id number] 1945/12/24  Procedure: Insertion of Arterial Catheter  Indications: Blood pressure monitoring and Frequent blood sampling  Procedure Details Consent: Unable to obtain consent because of emergent medical necessity. Time Out: Verified patient identification, verified procedure, site/side was marked, verified correct patient position, special equipment/implants available, medications/allergies/relevent history reviewed, required imaging and test results available.  Performed  Maximum sterile technique was used including antiseptics, cap, gloves, gown, hand hygiene, mask and sheet. Skin prep: Chlorhexidine; local anesthetic administered 22 gauge catheter was inserted into right radial artery using the Seldinger technique. ULTRASOUND GUIDANCE USED: YES Evaluation Blood flow good; BP tracing good. Complications: No apparent complications.  Hayden Pedro, AGACNP-BC [facility]  PCCM Pgr: [phone]

## 2019-12-17 NOTE — Progress Notes (Addendum)
Progress Note  Patient Name: Jose Collins Date of Encounter: 12/01/19  Primary Cardiologist: [place]   Pt intubated. He is awake.   Inpatient Medications    Scheduled Meds: . aspirin  300 mg Rectal Daily  . chlorhexidine  15 mL Mouth/Throat BID  . Chlorhexidine Gluconate Cloth  6 each Topical Daily  . Chlorhexidine Gluconate Cloth  6 each Topical Daily  . fentaNYL      . insulin aspart  2-6 Units Subcutaneous Q4H  . midazolam      . pantoprazole (PROTONIX) IV  40 mg Intravenous Q24H  . sodium chloride flush  10-40 mL Intracatheter Q12H   Continuous Infusions: . sodium chloride    . cefTRIAXone (ROCEPHIN)  IV    . dextrose 5 % and 0.45% NaCl    . DOPamine 9.74 mcg/kg/min (12-01-19 0315)  . doxycycline (VIBRAMYCIN) IV Stopped (Dec 01, 2019 [id number])  . epinephrine 4 mcg/min (01-Dec-2019 0900)  . fentaNYL infusion INTRAVENOUS 100 mcg/hr (2019-12-01 0900)  . heparin 1,150 Units/hr (01-Dec-2019 0900)  . insulin 5.5 mL/hr at 12-01-2019 0900  . magnesium sulfate LVP 250-500 ml 43.7 mL/hr at 01-Dec-2019 0900  . propofol (DIPRIVAN) infusion 12.5 mcg/kg/min (12/01/19 0900)   PRN Meds: dextrose, fentaNYL (SUBLIMAZE) injection, fentaNYL (SUBLIMAZE) injection, midazolam, midazolam, sodium chloride flush   Vital Signs    Vitals:   12/01/19 0600 12-01-2019 0700 12/01/2019 0752 01-Dec-2019 0800  BP:  (!) 125/58 (!) 111/51 (!) 99/52  Pulse: 65 60 78 (!) 57  Resp: 12 (!) 28 (!) 28 19  Temp:   98.2 F (36.8 C)   TempSrc:   Axillary   SpO2: 96% 100% 100% 100%  Weight:      Height:        Intake/Output Summary (Last 24 hours) at Dec 01, 2019 1053 Last data filed at 12/01/2019 0900 Gross per 24 hour  Intake 737.26 ml  Output -  Net 737.26 ml   Last 3 Weights December 01, 2019 12/14/2019  Weight (lbs) 175 lb 14.8 oz 175 lb  Weight (kg) 79.8 kg 79.379 kg      Telemetry    Paced rhythm - Personally Reviewed  ECG    Complete AV block - Personally Reviewed  Physical Exam   GEN:  Intubated, awake.  Neck: No JVD Cardiac: RRR, no murmurs, rubs, or gallops.  Respiratory: Clear to auscultation bilaterally. GI: Soft, non-distended  MS: No edema; No deformity. Neuro: moves all ext Psych: unable to assess  Labs    High Sensitivity Troponin:   Recent Labs  Lab 12/13/2019 1832 12-01-19 0118  TROPONINIHS 24,310* 24,585*      Chemistry Recent Labs  Lab 12/12/2019 1832 12/03/2019 1836  Dec 01, 2019 0118 12/01/2019 0244 12/01/19 0406  NA 134* 135   < > 139 136 138  K 4.5 4.6   < > 5.4* 5.1 4.9  CL 97* 104  --  101  --  100  CO2 16*  --   --  13*  --  14*  GLUCOSE 305* 293*  --  406*  --  527*  BUN 51* 47*  --  57*  --  64*  CREATININE 6.58* 6.40*  --  6.84*  --  7.05*  CALCIUM 8.4*  --   --  7.3*  --  7.3*  PROT 7.4  --   --   --   --  6.1*  ALBUMIN 3.6  --   --   --   --  2.9*  AST 738*  --   --   --   --  893*  ALT 591*  --   --   --   --  794*  ALKPHOS 121  --   --   --   --  134*  BILITOT 1.0  --   --   --   --  1.3*  GFRNONAA 8*  --   --  7*  --  7*  GFRAA 9*  --   --  8*  --  8*  ANIONGAP >20*  --   --  25*  --  24*   < > = values in this interval not displayed.     Hematology Recent Labs  Lab 11/19/2019 1832  12/05/2019 2347 12-09-2019 0244 Dec 09, 2019 0406  WBC 17.0*  --   --   --  17.5*  RBC 4.04*  --   --   --  3.54*  HGB 12.1*   < > 11.2* 11.2* 10.7*  HCT 39.4   < > 33.0* 33.0* 32.4*  MCV 97.5  --   --   --  91.5  MCH 30.0  --   --   --  30.2  MCHC 30.7  --   --   --  33.0  RDW 12.8  --   --   --  12.5  PLT 306  --   --   --  267   < > = values in this interval not displayed.    BNPNo results for input(s): BNP, PROBNP in the last 168 hours.   DDimer No results for input(s): DDIMER in the last 168 hours.   Radiology    Dg Chest Port 1 View  Result Date: 12/09/2019 CLINICAL DATA:  Central line placement. EXAM: PORTABLE CHEST 1 VIEW COMPARISON:  Radiograph yesterday. FINDINGS: Tip of the right internal jugular central venous catheter at the  atrial caval junction. No pneumothorax. Endotracheal tube tip 2.7 cm from the carina. Tip and side port of the enteric tube below the diaphragm in the stomach. Unchanged heart size and mediastinal contours. Increasing retrocardiac opacity. Persistent low lung volumes with vascular congestion. Chronic degenerative change of the shoulders. IMPRESSION: 1. Tip of the right internal jugular central venous catheter at the atrial caval junction. No pneumothorax. 2. Increasing retrocardiac opacity, may be atelectasis, pleural effusion, or combination thereof. 3. Low lung volumes with persistent vascular congestion. Electronically Signed   By: Keith Rake M.D.   On: 2019-12-09 04:48   Dg Chest Port 1 View  Result Date: 11/22/2019 CLINICAL DATA:  Status post intubation EXAM: PORTABLE CHEST 1 VIEW COMPARISON:  11/21/2019 FINDINGS: Cardiac shadow is again mildly enlarged but stable. Aortic calcifications are seen. The endotracheal tube is noted in satisfactory position. Gastric catheter extends into the stomach although the proximal side port lies in the area of the gastroesophageal junction. This could be advanced several cm further into the stomach. Lungs are well aerated bilaterally. Mild vascular congestion is seen without interstitial edema. IMPRESSION: Endotracheal tube and gastric catheter as described. The gastric catheter could be advanced further into stomach. Vascular congestion without interstitial edema. Electronically Signed   By: Inez Catalina M.D.   On: 11/30/2019 23:44   Dg Chest Port 1 View  Result Date: 12/04/2019 CLINICAL DATA:  Altered mental status. EXAM: PORTABLE CHEST 1 VIEW COMPARISON:  None. FINDINGS: The heart size and mediastinal contours are within normal limits. Both lungs are clear. The visualized skeletal structures are unremarkable. IMPRESSION: No active disease. Electronically Signed   By: Marijo Conception M.D.   On:  11/30/2019 21:53    Cardiac Studies    Patient Profile      74 y.o. male with gastric cancer and diabetes admitted with altered mental status and acute renal failure. EKG with inferior ST elevation. He was not taken to the cath lab given renal failure. His MI was thought to be late presenting. He has stopped treatment for cancer and is a partial code per family.   Assessment & Plan    1. Acute MI: late presenting acute MI. Troponin over 24,000. Inferior ST elevation on EKG. Complete heart block. Transcutaneous pacing. He was seen last night by Dr. Irish Lack and not felt to be a candidate for cath given his renal failure. He has advanced cancer and has stopped treatment for this per his daughter. He is a partial code but does not wish to have chest compressions. Given his renal failure, I would not consider him to be a candidate for cardiac cath at this time.  -Continue IV heparin and ASA.   2. Complete heart block: He is currently on dopamine and epinephrine. He is pacing intermittently with trans-cutaneous pads. I think it would be reasonable to consider placing a temporary transvenous pacing wire. Will discuss with primary team. I discussed this with his daughter this morning.   3. Cardiogenic shock: supportive measures only for now. Echo pending.   Addendum: PCCM moving to comfort care after discussion with family. Cardiology will sign off. Please call with questions.       For questions or updates, please contact [place] Please consult [email or web address] for contact info under        Signed, Lauree Chandler, MD  11-28-2019, 10:53 AM

## 2019-12-17 NOTE — Progress Notes (Signed)
Per orders, patient terminally extubated at 1447. Family at bedside.

## 2019-12-17 NOTE — Procedures (Signed)
Central Venous Catheter Insertion Procedure Note Jose Collins [id number] [birth year]  Procedure: Insertion of Central Venous Catheter Indications: Drug and/or fluid administration  Procedure Details Consent: Unable to obtain consent because of emergent medical necessity. Time Out: Verified patient identification, verified procedure, site/side was marked, verified correct patient position, special equipment/implants available, medications/allergies/relevent history reviewed, required imaging and test results available.  Performed  Maximum sterile technique was used including antiseptics, cap, gloves, gown, hand hygiene, mask and sheet. Skin prep: Chlorhexidine; local anesthetic administered A antimicrobial bonded/coated triple lumen catheter was placed in the right internal jugular vein using the Seldinger technique.  Evaluation Blood flow good Complications: No apparent complications Patient did tolerate procedure well. Chest X-ray ordered to verify placement.  CXR: pending.  Heparin held for procedure.  Restarted when completed  Jose Collins 12/04/2019, 4:08 AM

## 2019-12-17 NOTE — Progress Notes (Signed)
Medications wasted in [place]:  Morphine: 21mL Fentanyl: 72mL  Wasted with [employer]

## 2019-12-17 NOTE — Progress Notes (Signed)
CRITICAL VALUE ALERT  Critical Value:  [id number] TROPONIN  Date & Time Notied:  2019/11/26  0250  Provider Notified: Duwayne Heck MD  Orders Received/Actions taken:

## 2019-12-17 NOTE — Progress Notes (Signed)
ANTICOAGULATION CONSULT NOTE  Pharmacy Consult for Heparin  Indication: chest pain/ACS  No Known Allergies  Patient Measurements: Height: 5\' 5"  (165.1 cm) Weight: 175 lb (79.4 kg) IBW/kg (Calculated) : 61.5 Heparin Dosing Weight: 79.4 kg  Vital Signs: BP: 120/52 (12/09 0300) Pulse Rate: 64 (12/09 0300)  Labs: Recent Labs    12/13/2019 1832 12/02/2019 1836 12/06/2019 2347 2019/12/01 0118 12-01-19 0244 01-Dec-2019 0406  HGB 12.1* 13.6 11.2*  --  11.2* 10.7*  HCT 39.4 40.0 33.0*  --  33.0* 32.4*  PLT 306  --   --   --   --  267  LABPROT  --   --   --  16.8*  --   --   INR  --   --   --  1.4*  --   --   HEPARINUNFRC  --   --   --   --   --  0.11*  CREATININE 6.58* 6.40*  --  6.84*  --  7.05*  TROPONINIHS 24,310*  --   --  [id number]*  --   --     Estimated Creatinine Clearance: 9.2 mL/min (A) (by C-G formula based on SCr of 7.05 mg/dL (H)).  Assessment: 74 y.o. male with cardiogenic shock for heparin  Goal of Therapy:  Heparin level 0.3-0.7 units/ml Monitor platelets by anticoagulation protocol: Yes   Plan:  Heparin 2000 units IV bolus, then increase heparin 1150 units/hr Check heparin level in 8 hours.   Jose Collins, Bronson Curb December 01, 2019,5:47 AM

## 2019-12-17 NOTE — Progress Notes (Signed)
Chaplain engaged in initial visit with Jose Collins and his daughter, Lynelle Smoke.  Chaplain offered spiritual support to [place].  Chaplain will continue to follow-up.

## 2019-12-17 NOTE — Progress Notes (Signed)
eLink Physician-Brief Progress Note Patient Name: Jose Collins DOB: 28-Aug-1946 MRN: [id number]   Date of Service  12/09/2019  HPI/Events of Note  Pt admitted with hypotension and profound bradycardia in the context of subacute ACS. Pt also with a metabolic acidosis. He responded to sodium bicarbonate, as well as epinephrine and Dopamine infusions, he has TC pacing pads in place.  eICU Interventions  New patient evaluation completed, Pt given an additional amp of sodium bicarbonate.        [place] U Ogan 09-Dec-2019, 3:07 AM

## 2019-12-17 NOTE — Progress Notes (Signed)
NAME:  Jose Collins, MRN:  [id number], DOB:  [birth year], LOS: 0 ADMISSION DATE:  11/26/2019, CONSULTATION DATE:  12/01/2019  CHIEF COMPLAINT:  STEMI  Brief History   The patient was admitted from [facility] emergency department to [facility], ICU.  After several days of chest pain he was found to have a STEMI and complete heart block.  Consults:  Cardiology  Antimicrobials:  Ceftriaxone and doxycycline for CAP coverage  Interim history/subjective:  Night the patient developed worsening complete heart block.  He has had difficulty with transcutaneous pacing.  He is on epinephrine, and norepinephrine, and dopamine.  His lactic acid is climbing.  Objective   Blood pressure (!) 99/52, pulse (!) 57, temperature 98.2 F (36.8 C), temperature source Axillary, resp. rate 19, height 5\' 5"  (1.651 m), weight 79.8 kg, SpO2 100 %.    Vent Mode: PRVC FiO2 (%):  [50 %-100 %] 50 % Set Rate:  [18 bmp-28 bmp] 28 bmp Vt Set:  [370 mL-490 mL] 490 mL PEEP:  [5 cmH20] 5 cmH20 Plateau Pressure:  [18 cmH20-19 cmH20] 19 cmH20   Intake/Output Summary (Last 24 hours) at 11-25-19 1125 Last data filed at November 25, 2019 1100 Gross per 24 hour  Intake 835.67 ml  Output 125 ml  Net 710.67 ml   Filed Weights   12/16/2019 1819 Nov 25, 2019 0500  Weight: 79.4 kg 79.8 kg    Examination: General: Intubated, sedated HENT: Atraumatic, endotracheal tube in place Lungs: Diminished, mechanical ventilation sounds auscultated, no wheezes or crackles Cardiovascular: bradyCardiac, irregular Abdomen: Soft, nondistended, normal bowel sounds Extremities: No edema Neuro: Sedated, does have some spontaneous movements, nothing purposeful MSK: No rashes Lines: Central line, arterial line  Assessment & Plan:  Patient is a 74 year old gentleman with a history of gastric cancer now off all chemotherapy.  He presents after several days of chest pain, was found to have a STEMI.  He now has complete heart block.  And cardiogenic  shock.  Acute hypoxemic  respiratory failure STEMI Cardiogenic shock Complete heart block Lactic acidosis Acute renal failure Gastric cancer off chemotherapy   Patient is not appropriate for heart cath based on timing of STEMI, as well as kidney injury.  This was discussed with the family.  We have been attempting pharmacologic and transcutaneous pacing with some benefit.  However the patient has worsening lactic acidosis and renal failure.  I discussed these medical problems in light of his gastric cancer with the patient's daughter.  She says he would not have wanted any of this and would like to focus on his comfort only.  This is medically appropriate.  The plan will be to transition to comfort measures only once additional family members can arrive. Labs   CBC: Recent Labs  Lab 11/27/2019 1832 11/22/2019 1836 12/11/2019 2347 11/25/19 0244 11/25/19 0406  WBC 17.0*  --   --   --  17.5*  NEUTROABS 12.2*  --   --   --   --   HGB 12.1* 13.6 11.2* 11.2* 10.7*  HCT 39.4 40.0 33.0* 33.0* 32.4*  MCV 97.5  --   --   --  91.5  PLT 306  --   --   --  [id number]    Basic Metabolic Panel: Recent Labs  Lab 11/18/2019 1832 11/25/2019 1836 12/10/2019 2347 11-25-2019 0118 25-Nov-2019 0244 11-25-2019 0406  NA 134* 135 136 139 136 138  K 4.5 4.6 5.0 5.4* 5.1 4.9  CL 97* 104  --  101  --  100  CO2 16*  --   --  13*  --  14*  GLUCOSE 305* 293*  --  406*  --  527*  BUN 51* 47*  --  57*  --  64*  CREATININE 6.58* 6.40*  --  6.84*  --  7.05*  CALCIUM 8.4*  --   --  7.3*  --  7.3*  MG  --   --   --   --   --  1.5*  PHOS  --   --   --   --   --  8.0*   GFR: Estimated Creatinine Clearance: 9.2 mL/min (A) (by C-G formula based on SCr of 7.05 mg/dL (H)). Recent Labs  Lab 11/26/2019 1832 12/16/19 0118 12/16/19 0406  PROCALCITON  --  1.40 3.45  WBC 17.0*  --  17.5*  LATICACIDVEN  --  6.8*  --     Liver Function Tests: Recent Labs  Lab 11/30/2019 1832 Dec 16, 2019 0406  AST 738* 893*  ALT 591* 794*  ALKPHOS  121 134*  BILITOT 1.0 1.3*  PROT 7.4 6.1*  ALBUMIN 3.6 2.9*   No results for input(s): LIPASE, AMYLASE in the last 168 hours. No results for input(s): AMMONIA in the last 168 hours.  ABG    Component Value Date/Time   PHART 7.195 (LL) 12-16-2019 0244   PCO2ART 43.3 December 16, 2019 0244   PO2ART 90.0 12/16/19 0244   HCO3 16.8 (L) 2019-12-16 0244   TCO2 18 (L) Dec 16, 2019 0244   ACIDBASEDEF 11.0 (H) 12/16/19 0244   O2SAT 95.0 12/16/19 0244     Coagulation Profile: Recent Labs  Lab Dec 16, 2019 0118  INR 1.4*    Cardiac Enzymes: No results for input(s): CKTOTAL, CKMB, CKMBINDEX, TROPONINI in the last 168 hours.  HbA1C: No results found for: HGBA1C  CBG: Recent Labs  Lab 2019-12-16 0449 12/16/19 0756 Dec 16, 2019 0846 16-Dec-2019 0917 12-16-2019 0946  GLUCAP 268* 474* 417* 421* 389*    Critical care time:   The patient is critically ill with multiple organ systems failure and requires high complexity decision making for assessment and support, frequent evaluation and titration of therapies, application of advanced monitoring technologies and extensive interpretation of multiple databases.   Critical Care Time devoted to patient care services described in this note is 57 minutes. This time reflects the time of my personal involvement. This critical care time does not reflect separately billable procedures or procedure time, teaching time or supervisory time of PA/NP/Med student/Med Resident etc but could involve care discussion time.  [facility] December 16, 2019 11:25 AM  Pager: [phone] After hours pager: [phone]

## 2019-12-17 NOTE — H&P (Signed)
NAME:  Jose Collins, MRN:  [id number], DOB:  1946/04/02, LOS: 0 ADMISSION DATE:  12/04/2019, CONSULTATION DATE:  11/22/2019 REFERRING MD:  Vanita Panda, ED, CHIEF COMPLAINT:  Altered mental status  Brief History   Jose Collins is a 74 year old man with a history of DM, GERD, stomach cancer, here with Inferior MI, complete heart block, hypotension likely 2/2 cardiogenic shock.    History of present illness   Jose Collins has been feeling poorly for the past week, per his daughter and son.  Jose Collins has been short of breath for 1 week, chest pain for a few days.  Today, Jose Collins became altered, was brought to ED emergently.  Initially was not responsive, agonal breaths.   Bradycardic, Transcutaneous pacer started. Remained hypotensive.   Transferred here for cardiology given findings c/w Inferior MI.   Cardiology evaluated, patient was agitated.  Being transcutaneously paced, remained hypotensive.  Not taken to cath lab, given MI likley old, renal failure.  Transvenous pacer not done given agitation.    On my evaluation, HR 30s, not being paced.  Hypotensive, minimally responsive.   Started TC pacer, 35mA initially only palpable pulse every three captured beats or so.  Remained hypotensive.   Stopped dobutamine (at 2.5), increased dopamine from 52mcgmin to 10.   Pacing improved with increased pad adherence. Replaced pads and pacing improved further.   Added epinephrine for persistent hypotension, intermittent capture on TC pacer, bradycardia.   BP improved, Native rhythm in 60-70s.    Persistent increased WOB, wheeze, rhonchi, and ams, thus intubated patient.  Likely component of cardiogenic pulm edema, metabolic acidosis 2/2 poor perfusion.   Discussed with Cardiologist.    Past Medical History  DM Gastric cancer  GERD  Significant Hospital Events     Consults:  Cardiology  Procedures:  12/8 Intubation 12/8 Arterial line  Significant Diagnostic Tests:  CXR 12/8: cardiomegaly  Micro Data:     Antimicrobials:  12/9 Ctx  12/9 Doxy  Interim history/subjective:    Objective   Blood pressure (!) 113/50, pulse 66, resp. rate 18, height 5\' 5"  (1.651 m), weight 79.4 kg, SpO2 100 %.    Vent Mode: PRVC FiO2 (%):  [100 %] 100 % Set Rate:  [18 bmp] 18 bmp Vt Set:  [490 mL] 490 mL PEEP:  [5 cmH20] 5 cmH20 Plateau Pressure:  [18 cmH20] 18 cmH20  No intake or output data in the 24 hours ending 11/30/2019 0040 Filed Weights   11/19/2019 1819  Weight: 79.4 kg    Examination: General: only moans with sternal rub, opens eyes briefly HENT: NCAT, edentulous Lungs: Rhonchi B Cardiovascular: Brady no mgr Abdomen: NT, ND, NBS Extremities: 2+pitting edema in R LE B, trace in LLE to shins Neuro: minimally responsive GU:   Resolved Hospital Problem list     Assessment & Plan:   1. Complete heart block, hypotension: Likely cardiogenic shock 2/2 myocardial infarction.  Cardiology plans for medical management at this time, given risk of PCI and likely age of MI and myocardial injury.   Continue epi, dopamine, and tc pacing if needed.  Formal echo pending.  Jose Collins does have a leukocytosis which I suspect is reactive, will cover with antibiotoics for CAP, though lower suspicion.  Procal pending.    2. Respiratory distress: increased WOB, AMS --> intubated.  Likely 2/2 pulmonary edema in setting of heart failure.   ABG revealed expected metabolic acidosis.  Increase RR to compensate from resp standpoint.    3. Inferior STEMI: see  cardiology note.   Cont heparin gtt.    Transvenous pacer considered, but stable with epi and dopamine at this time.   Will call cards back if needed.    4. AKI: no known hx of renal failure.  Suspect ATN 2/2 poor perfusion in setting of cardiogenic shock.   UA pending.  Foley for strict I/O.   Best practice:  Diet: NPO Pain/Anxiety/Delirium protocol (if indicated): yes VAP protocol (if indicated): yes DVT prophylaxis: on heparin gtt GI prophylaxis: ppi  Glucose control: ISS Mobility: bedrest Code Status: no compressions.   Family Communication: Son and daughter at bedside Disposition: ICU.  Labs   CBC: Recent Labs  Lab 11/29/2019 1832 12/15/2019 1836 12/07/2019 2347  WBC 17.0*  --   --   NEUTROABS 12.2*  --   --   HGB 12.1* 13.6 11.2*  HCT 39.4 40.0 33.0*  MCV 97.5  --   --   PLT 306  --   --     Basic Metabolic Panel: Recent Labs  Lab 12/13/2019 1832 12/11/2019 1836 12/12/2019 2347  NA 134* 135 136  K 4.5 4.6 5.0  CL 97* 104  --   CO2 16*  --   --   GLUCOSE 305* 293*  --   BUN 51* 47*  --   CREATININE 6.58* 6.40*  --   CALCIUM 8.4*  --   --    GFR: Estimated Creatinine Clearance: 10.1 mL/min (A) (by C-G formula based on SCr of 6.4 mg/dL (H)). Recent Labs  Lab 11/26/2019 1832  WBC 17.0*    Liver Function Tests: Recent Labs  Lab 11/19/2019 1832  AST 738*  ALT 591*  ALKPHOS 121  BILITOT 1.0  PROT 7.4  ALBUMIN 3.6   No results for input(s): LIPASE, AMYLASE in the last 168 hours. No results for input(s): AMMONIA in the last 168 hours.  ABG    Component Value Date/Time   PHART 7.184 (LL) 12/07/2019 2347   PCO2ART 39.1 12/16/2019 2347   PO2ART 236.0 (H) 11/25/2019 2347   HCO3 14.8 (L) 11/22/2019 2347   TCO2 16 (L) 11/16/2019 2347   ACIDBASEDEF 13.0 (H) 11/20/2019 2347   O2SAT 100.0 11/27/2019 2347     Coagulation Profile: No results for input(s): INR, PROTIME in the last 168 hours.  Cardiac Enzymes: No results for input(s): CKTOTAL, CKMB, CKMBINDEX, TROPONINI in the last 168 hours.  HbA1C: No results found for: HGBA1C  CBG: Recent Labs  Lab 12/04/2019 1828 11-25-19 0001  GLUCAP 250* 315*    Review of Systems:   Unable to assess  Past Medical History  Jose Collins,  has a past medical history of Diabetes mellitus without complication ([place]), GERD (gastroesophageal reflux disease), and Stomach cancer ([place]).   Surgical History      Social History      Family History   His family history is not on file.    Allergies No Known Allergies   Home Medications  Prior to Admission medications   Medication Sig Start Date End Date Taking? Authorizing Provider  amLODipine (NORVASC) 5 MG tablet Take 5 mg by mouth daily.   Yes [provider]  carvedilol (COREG) 25 MG tablet Take 12.5 mg by mouth 2 (two) times daily with a meal.   Yes [provider]  Cholecalciferol (VITAMIN D-3) 25 MCG (1000 UT) CAPS Take 1,000 Units by mouth daily.   Yes [provider]  finasteride (PROSCAR) 5 MG tablet Take 5 mg by mouth daily.   Yes [provider]  lisinopril (ZESTRIL) 10 MG tablet Take 10 mg by mouth daily.   Yes [provider]  metFORMIN (GLUCOPHAGE-XR) 500 MG 24 hr tablet Take 1,000 mg by mouth daily with breakfast.   Yes [provider]  omeprazole (PRILOSEC) 40 MG capsule Take 40 mg by mouth daily before breakfast.    Yes [provider]  tamsulosin (FLOMAX) 0.4 MG CAPS capsule Take 0.4 mg by mouth at bedtime.   Yes [provider]  insulin aspart protamine - aspart (NOVOLOG MIX 70/30 FLEXPEN) (70-30) 100 UNIT/ML FlexPen Inject 15 Units into the skin 2 (two) times daily before a meal.    [provider]  simvastatin (ZOCOR) 5 MG tablet Take 5 mg by mouth at bedtime.    [provider]     Critical care time: 75 min.

## 2019-12-17 NOTE — Death Summary Note (Signed)
DEATH SUMMARY   Patient Details  Name: Jose Collins MRN: [id number] DOB: 01-13-1946  Admission/Discharge Information   Admit Date:  2019-12-17  Date of Death: Date of Death: 2019/12/18  Time of Death: Time of Death: 04-02-1625  Length of Stay: 1  Referring Physician: Elliott   Reason(s) for Hospitalization  STEMI  Diagnoses  Preliminary cause of death:  Secondary Diagnoses (including complications and co-morbidities):  Active Problems:   Cardiogenic shock [facility])   Brief Hospital Course (including significant findings, care, treatment, and services provided and events leading to death)  Jose Collins is a 74 y.o. year old male who was admitted from [facility] emergency department to [facility], ICU.  History of gastric cancer no longer on chemotherapy. After several days of chest pain he was found to have a STEMI and complete heart block. He had CKD and and worsening acute kidney injury.  The patient was not thought to be a good candidate for heart catheterization given his subacute presentation as well as his kidney injury.  The patient continued to have worsening cardiogenic shock, lactic acidosis, and multiorgan failure.  His heart block did not improve and he was refractory to dopamine, as well as transcutaneous pacing.  His family came in and stated that he would not want any of this.  They chose to make him comfort care.  He passed away peacefully with family at bedside at 16:26.   Pertinent Labs and Studies  Significant Diagnostic Studies Dg Chest Port 1 View  Result Date: 18-Dec-2019 CLINICAL DATA:  Central line placement. EXAM: PORTABLE CHEST 1 VIEW COMPARISON:  Radiograph yesterday. FINDINGS: Tip of the right internal jugular central venous catheter at the atrial caval junction. No pneumothorax. Endotracheal tube tip 2.7 cm from the carina. Tip and side port of the enteric tube below the diaphragm in the stomach. Unchanged heart size and mediastinal contours. Increasing  retrocardiac opacity. Persistent low lung volumes with vascular congestion. Chronic degenerative change of the shoulders. IMPRESSION: 1. Tip of the right internal jugular central venous catheter at the atrial caval junction. No pneumothorax. 2. Increasing retrocardiac opacity, may be atelectasis, pleural effusion, or combination thereof. 3. Low lung volumes with persistent vascular congestion. Electronically Signed   By: Keith Rake M.D.   On: 12/18/19 04:48   Dg Chest Port 1 View  Result Date: Dec 17, 2019 CLINICAL DATA:  Status post intubation EXAM: PORTABLE CHEST 1 VIEW COMPARISON:  12/17/2019 FINDINGS: Cardiac shadow is again mildly enlarged but stable. Aortic calcifications are seen. The endotracheal tube is noted in satisfactory position. Gastric catheter extends into the stomach although the proximal side port lies in the area of the gastroesophageal junction. This could be advanced several cm further into the stomach. Lungs are well aerated bilaterally. Mild vascular congestion is seen without interstitial edema. IMPRESSION: Endotracheal tube and gastric catheter as described. The gastric catheter could be advanced further into stomach. Vascular congestion without interstitial edema. Electronically Signed   By: Inez Catalina M.D.   On: 12/17/2019 23:44   Dg Chest Port 1 View  Result Date: 12-17-2019 CLINICAL DATA:  Altered mental status. EXAM: PORTABLE CHEST 1 VIEW COMPARISON:  None. FINDINGS: The heart size and mediastinal contours are within normal limits. Both lungs are clear. The visualized skeletal structures are unremarkable. IMPRESSION: No active disease. Electronically Signed   By: Marijo Conception M.D.   On: 17-Dec-2019 21:53    Microbiology Recent Results (from the past 240 hour(s))  Respiratory Panel by RT PCR (  Flu A&B, Covid) - Nasopharyngeal Swab     Status: None   Collection Time: 11/25/2019  7:17 PM   Specimen: Nasopharyngeal Swab  Result Value Ref Range Status   SARS  Coronavirus 2 by RT PCR NEGATIVE NEGATIVE Final    Comment: (NOTE) SARS-CoV-2 target nucleic acids are NOT DETECTED. The SARS-CoV-2 RNA is generally detectable in upper respiratoy specimens during the acute phase of infection. The lowest concentration of SARS-CoV-2 viral copies this assay can detect is 131 copies/mL. A negative result does not preclude SARS-Cov-2 infection and should not be used as the sole basis for treatment or other patient management decisions. A negative result may occur with  improper specimen collection/handling, submission of specimen other than nasopharyngeal swab, presence of viral mutation(s) within the areas targeted by this assay, and inadequate number of viral copies (<131 copies/mL). A negative result must be combined with clinical observations, patient history, and epidemiological information. The expected result is Negative. Fact Sheet for Patients:  PinkCheek.be Fact Sheet for Healthcare Providers:  GravelBags.it This test is not yet ap proved or cleared by the [place] FDA and  has been authorized for detection and/or diagnosis of SARS-CoV-2 by FDA under an Emergency Use Authorization (EUA). This EUA will remain  in effect (meaning this test can be used) for the duration of the COVID-19 declaration under Section 564(b)(1) of the Act, 21 U.S.C. section 360bbb-3(b)(1), unless the authorization is terminated or revoked sooner.    Influenza A by PCR NEGATIVE NEGATIVE Final   Influenza B by PCR NEGATIVE NEGATIVE Final    Comment: (NOTE) The Xpert Xpress SARS-CoV-2/FLU/RSV assay is intended as an aid in  the diagnosis of influenza from Nasopharyngeal swab specimens and  should not be used as a sole basis for treatment. Nasal washings and  aspirates are unacceptable for Xpert Xpress SARS-CoV-2/FLU/RSV  testing. Fact Sheet for Patients: PinkCheek.be Fact Sheet  for Healthcare Providers: GravelBags.it This test is not yet approved or cleared by the [place] FDA and  has been authorized for detection and/or diagnosis of SARS-CoV-2 by  FDA under an Emergency Use Authorization (EUA). This EUA will remain  in effect (meaning this test can be used) for the duration of the  Covid-19 declaration under Section 564(b)(1) of the Act, 21  U.S.C. section 360bbb-3(b)(1), unless the authorization is  terminated or revoked. Performed at [facility], [address], [place], [place] [address]   Culture, blood (routine x 2)     Status: None (Preliminary result)   Collection Time: 10-Dec-2019  1:17 AM   Specimen: BLOOD  Result Value Ref Range Status   Specimen Description BLOOD LEFT HAND  Final   Special Requests   Final    BOTTLES DRAWN AEROBIC AND ANAEROBIC Blood Culture adequate volume   Culture   Final    NO GROWTH < 24 HOURS Performed at [facility] [address], [place], [place] [address]    Report Status PENDING  Incomplete  Culture, blood (routine x 2)     Status: None (Preliminary result)   Collection Time: 12-10-2019  3:15 AM   Specimen: BLOOD  Result Value Ref Range Status   Specimen Description BLOOD RIGHT HAND  Final   Special Requests   Final    BOTTLES DRAWN AEROBIC ONLY Blood Culture results may not be optimal due to an inadequate volume of blood received in culture bottles   Culture   Final    NO GROWTH < 12 HOURS Performed at [facility], [place] [address], [place], [place] [address]    Report Status PENDING  Incomplete  MRSA PCR Screening     Status: None   Collection Time: 12-12-19  3:42 AM   Specimen: Nasopharyngeal  Result Value Ref Range Status   MRSA by PCR NEGATIVE NEGATIVE Final    Comment:        The GeneXpert MRSA Assay (FDA approved for NASAL specimens only), is one component of a comprehensive MRSA colonization surveillance program. It is not intended to diagnose  MRSA infection nor to guide or monitor treatment for MRSA infections. Performed at [facility], [place] [address], [place],  [address]     Lab Basic Metabolic Panel: Recent Labs  Lab 12/15/2019 1832 11/16/2019 1836 11/27/2019 2347 12-Dec-2019 0118 12-Dec-2019 0244 12-Dec-2019 0406  NA 134* 135 136 139 136 138  K 4.5 4.6 5.0 5.4* 5.1 4.9  CL 97* 104  --  101  --  100  CO2 16*  --   --  13*  --  14*  GLUCOSE 305* 293*  --  406*  --  527*  BUN 51* 47*  --  57*  --  64*  CREATININE 6.58* 6.40*  --  6.84*  --  7.05*  CALCIUM 8.4*  --   --  7.3*  --  7.3*  MG  --   --   --   --   --  1.5*  PHOS  --   --   --   --   --  8.0*   Liver Function Tests: Recent Labs  Lab 11/29/2019 1832 12/12/19 0406  AST 738* 893*  ALT 591* 794*  ALKPHOS 121 134*  BILITOT 1.0 1.3*  PROT 7.4 6.1*  ALBUMIN 3.6 2.9*   No results for input(s): LIPASE, AMYLASE in the last 168 hours. No results for input(s): AMMONIA in the last 168 hours. CBC: Recent Labs  Lab 11/30/2019 1832 12/07/2019 1836 12/11/2019 2347 12-12-2019 0244 2019-12-12 0406  WBC 17.0*  --   --   --  17.5*  NEUTROABS 12.2*  --   --   --   --   HGB 12.1* 13.6 11.2* 11.2* 10.7*  HCT 39.4 40.0 33.0* 33.0* 32.4*  MCV 97.5  --   --   --  91.5  PLT 306  --   --   --  267   Cardiac Enzymes: No results for input(s): CKTOTAL, CKMB, CKMBINDEX, TROPONINI in the last 168 hours. Sepsis Labs: Recent Labs  Lab 11/23/2019 1832 12-12-19 0118 12-12-2019 0406  PROCALCITON  --  1.40 3.45  WBC 17.0*  --  17.5*  LATICACIDVEN  --  6.8*  --      Spero Geralds Dec 12, 2019, 6:16 PM

## 2019-12-17 NOTE — Progress Notes (Signed)
Pt extubated using withdrawal guidelines.  RN @ bedside. 

## 2019-12-17 NOTE — ED Notes (Signed)
Jose Collins - son - [phone] Jose Collins - Daughter - [phone]  Please call w/ any chances.

## 2019-12-17 NOTE — Progress Notes (Signed)
eLink Physician-Brief Progress Note Patient Name: Jose Collins DOB: 1946/06/10 MRN: [id number]   Date of Service  11/29/19  HPI/Events of Note  Pt is on the ventilator and needs stress ulcer prophylaxis  eICU Interventions  Protonix 40 mg iv daily ordered        Frederik Pear 11-29-19, 5:58 AM

## 2019-12-17 NOTE — Progress Notes (Signed)
Pulled central line back by 3 cm after reviewing CXR.  Now at 15cm.

## 2019-12-17 NOTE — Progress Notes (Signed)
CRITICAL VALUE ALERT  Critical Value: 527  Date & Time Notied:  Dec 02, 2019 0520  Provider Notified: Warren Lacy provider  Orders Received/Actions taken: Will be starting insulin gtt per protocol

## 2019-12-17 NOTE — Progress Notes (Signed)
  Echocardiogram 2D Echocardiogram has been performed.  Jose Collins 10-Dec-2019, 10:30 AM

## 2019-12-17 DEATH — deceased

## 2021-08-10 IMAGING — DX DG CHEST 1V PORT
1 series · 1 of 1 positions shown · non-contrast
Comparison: Radiograph yesterday.

CLINICAL DATA: Central line placement.

EXAM:
PORTABLE CHEST 1 VIEW

[chest ap]
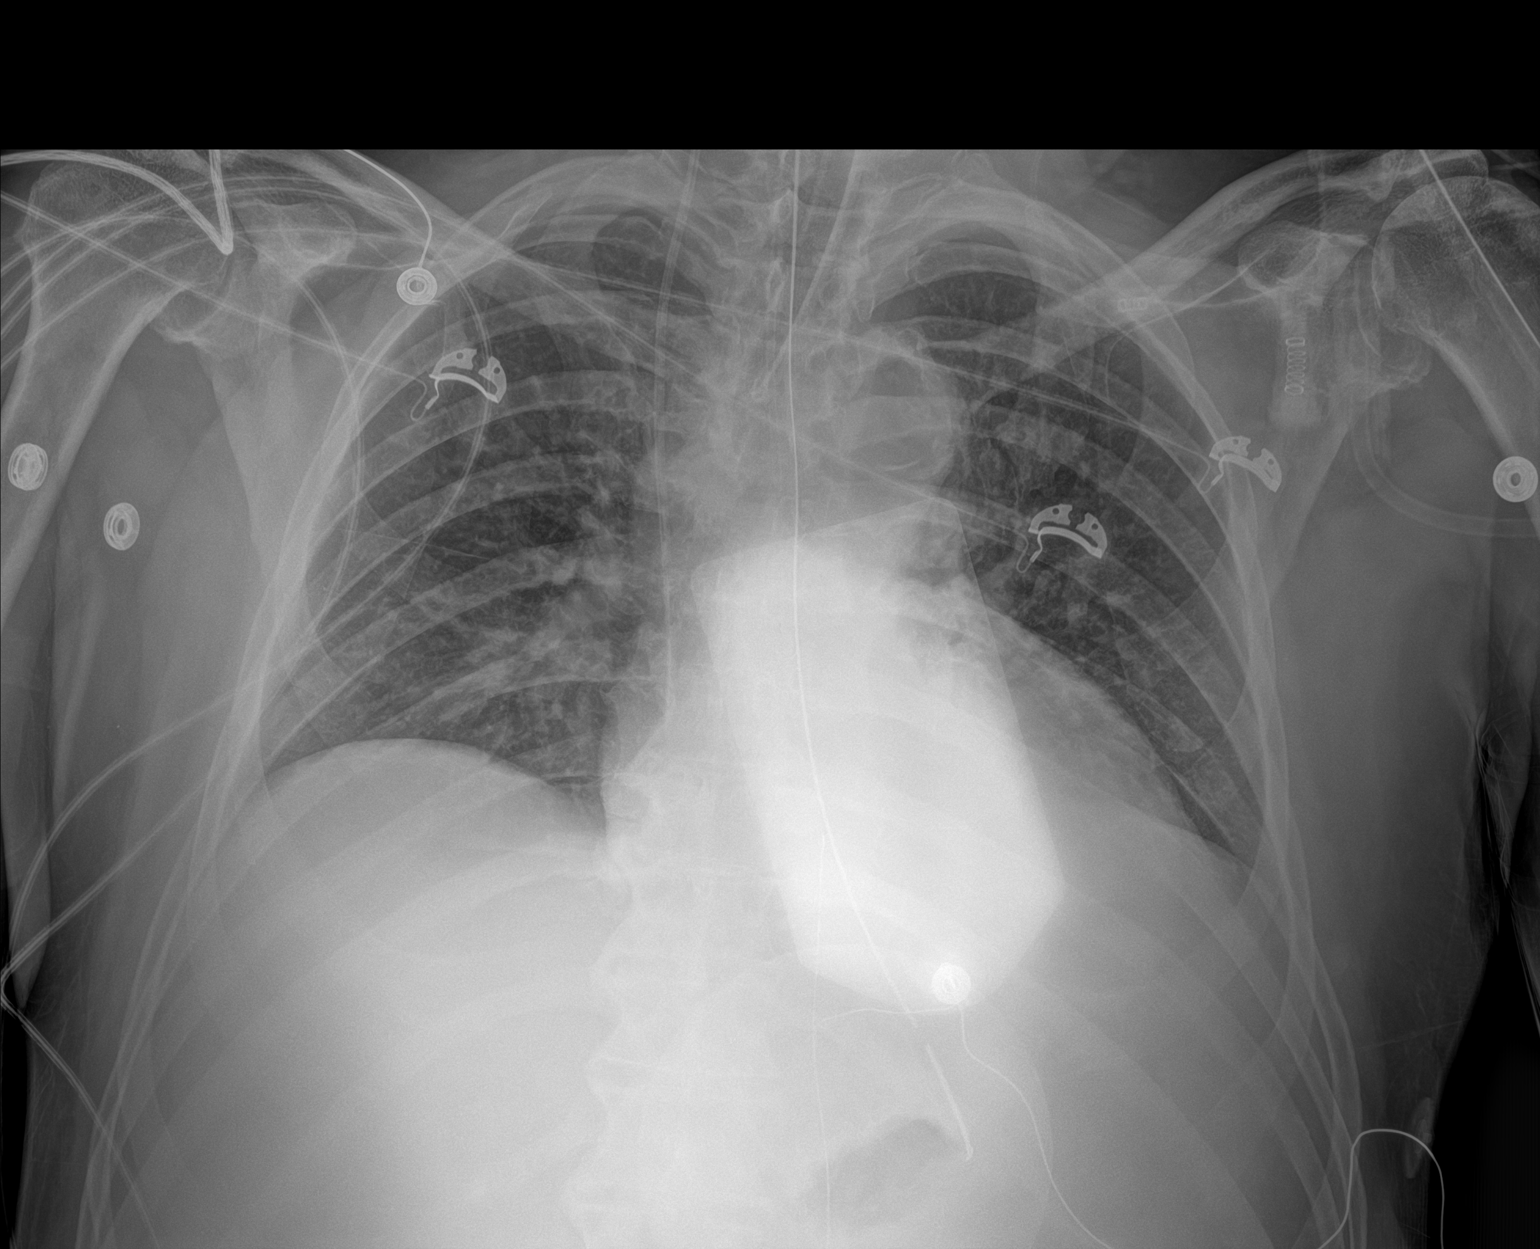

[1 of 1 positions shown; findings below may reference images not displayed]

FINDINGS: Tip of the right internal jugular central venous catheter at the
atrial caval junction. No pneumothorax. Endotracheal tube tip 2.7 cm
from the carina. Tip and side port of the enteric tube below the
diaphragm in the stomach. Unchanged heart size and mediastinal
contours. Increasing retrocardiac opacity. Persistent low lung
volumes with vascular congestion. Chronic degenerative change of the
shoulders.
IMPRESSION: 1. Tip of the right internal jugular central venous catheter at the
atrial caval junction. No pneumothorax.
2. Increasing retrocardiac opacity, may be atelectasis, pleural
effusion, or combination thereof.
3. Low lung volumes with persistent vascular congestion.
